# Patient Record
Sex: Male | Born: 1980 | Race: White | Hispanic: No | Marital: Single | Smoking: Never smoker
Health system: Southern US, Community
[De-identification: ages and names within clinical notes are randomized; demographics above are authoritative.]

---

## 2013-12-14 ENCOUNTER — Emergency Department (HOSPITAL_COMMUNITY)
Admission: EM | Admit: 2013-12-14 | Discharge: 2013-12-15 | Disposition: A | Discharge status: Home / Self Care | Payer: BC Managed Care – PPO | Attending: Emergency Medicine | Admitting: Emergency Medicine

## 2013-12-14 ENCOUNTER — Encounter (HOSPITAL_COMMUNITY): Payer: Self-pay | Admitting: Emergency Medicine

## 2013-12-14 DIAGNOSIS — J189 Pneumonia, unspecified organism: Secondary | ICD-10-CM

## 2013-12-14 DIAGNOSIS — IMO0001 Reserved for inherently not codable concepts without codable children: Secondary | ICD-10-CM | POA: Insufficient documentation

## 2013-12-14 DIAGNOSIS — R209 Unspecified disturbances of skin sensation: Secondary | ICD-10-CM | POA: Insufficient documentation

## 2013-12-14 DIAGNOSIS — J159 Unspecified bacterial pneumonia: Secondary | ICD-10-CM | POA: Insufficient documentation

## 2013-12-14 NOTE — ED Notes (Signed)
Up to nurse first, states, "meds starting to wear off, HA returning". Alert, NAD, calm, interactive, ambulatory with steady gait, wearing mask.

## 2013-12-14 NOTE — ED Notes (Signed)
C/o URI, cough, congestion, sore throat, post nasal drip, fever, post tussive emesis, (denies: nausea or diarrhea), has been taking mucinex, sudafed, dayquil & advil. Not getting better after 2 weeks.

## 2013-12-15 ENCOUNTER — Emergency Department (HOSPITAL_COMMUNITY): Payer: BC Managed Care – PPO

## 2013-12-15 MED ORDER — BENZONATATE 100 MG PO CAPS
100.0000 mg | ORAL_CAPSULE | Freq: Once | ORAL | Status: AC
  Administered 2013-12-15: 100 mg via ORAL
  Filled 2013-12-15: qty 1

## 2013-12-15 MED ORDER — AZITHROMYCIN 250 MG PO TABS
500.0000 mg | ORAL_TABLET | Freq: Once | ORAL | Status: AC
  Administered 2013-12-15: 500 mg via ORAL
  Filled 2013-12-15: qty 2

## 2013-12-15 MED ORDER — ACETAMINOPHEN-CODEINE 120-12 MG/5ML PO SUSP: 5.0000 mL | Freq: Four times a day (QID) | ORAL | Status: DC | PRN

## 2013-12-15 MED ORDER — ALBUTEROL SULFATE HFA 108 (90 BASE) MCG/ACT IN AERS
2.0000 | INHALATION_SPRAY | RESPIRATORY_TRACT | Status: DC | PRN
  Administered 2013-12-15: 2 via RESPIRATORY_TRACT
  Filled 2013-12-15 (×2): qty 6.7

## 2013-12-15 MED ORDER — IBUPROFEN 800 MG PO TABS: 800.0000 mg | ORAL_TABLET | Freq: Three times a day (TID) | ORAL | Status: DC

## 2013-12-15 MED ORDER — AZITHROMYCIN 250 MG PO TABS: 250.0000 mg | ORAL_TABLET | Freq: Every day | ORAL | Status: DC

## 2013-12-15 MED ORDER — IPRATROPIUM BROMIDE 0.02 % IN SOLN
0.5000 mg | Freq: Once | RESPIRATORY_TRACT | Status: AC
  Administered 2013-12-15: 0.5 mg via RESPIRATORY_TRACT
  Filled 2013-12-15: qty 2.5

## 2013-12-15 MED ORDER — KETOROLAC TROMETHAMINE 60 MG/2ML IM SOLN
60.0000 mg | Freq: Once | INTRAMUSCULAR | Status: AC
  Administered 2013-12-15: 60 mg via INTRAMUSCULAR
  Filled 2013-12-15: qty 2

## 2013-12-15 MED ORDER — ALBUTEROL SULFATE (2.5 MG/3ML) 0.083% IN NEBU
5.0000 mg | INHALATION_SOLUTION | Freq: Once | RESPIRATORY_TRACT | Status: AC
  Administered 2013-12-15: 5 mg via RESPIRATORY_TRACT
  Filled 2013-12-15: qty 6

## 2013-12-15 NOTE — ED Notes (Signed)
Diffuse ronchi through all lung fields clearing  Sl with coughing

## 2013-12-15 NOTE — ED Notes (Signed)
The pt is c/o a cough for 2 weeks with a temp hurting all over and he cannot rest unless he sits upright.   Hoarse cough at present

## 2013-12-15 NOTE — ED Provider Notes (Signed)
CSN: [id number]     Arrival date & time 12/14/13  2203 History   First MD Initiated Contact with Patient 12/15/13 0007     Chief Complaint  Patient presents with  . URI  . Nasal Congestion  . Cough  . Sore Throat   (Consider location/radiation/quality/duration/timing/severity/associated sxs/prior Treatment) HPI History provided by patient. Has been sick for the last 2 weeks with on and off fevers, persistent cough, congestion and sore throat. Multiple sick contacts including significant other at home, in addition to work contacts who have had similar symptoms. Taking over-the-counter medications with minimal relief. No rash. No recent travel. He denies any other medical problems. Is not a smoker. Symptoms moderate in severity and persistent which prompted him to be evaluated tonight.  History reviewed. No pertinent past medical history. History reviewed. No pertinent past surgical history. No family history on file. History  Substance Use Topics  . Smoking status: Never Smoker   . Smokeless tobacco: Not on file  . Alcohol Use: No    Review of Systems  Constitutional: Negative for fever and chills.  HENT: Positive for sore throat.   Respiratory: Positive for cough.   Cardiovascular: Negative for leg swelling.  Gastrointestinal: Negative for abdominal pain.  Genitourinary: Negative for dysuria.  Musculoskeletal: Positive for myalgias.  Skin: Negative for rash.  Neurological: Positive for numbness. Negative for weakness.  All other systems reviewed and are negative.    Allergies  Review of patient's allergies indicates no known allergies.  Home Medications  No current outpatient prescriptions on file. BP 151/97  Pulse 101  Temp(Src) 98.3 F (36.8 C) (Oral)  Resp 20  Wt 205 lb (92.987 kg)  SpO2 98% Physical Exam  Constitutional: He is oriented to person, place, and time. He appears well-developed and well-nourished.  HENT:  Head: Normocephalic and atraumatic.   Mouth/Throat: Oropharynx is clear and moist. No oropharyngeal exudate.  Eyes: EOM are normal. Pupils are equal, round, and reactive to light.  Neck: Neck supple.  Cardiovascular: Regular rhythm and intact distal pulses.   HR 101  Pulmonary/Chest: Effort normal. No stridor. No respiratory distress. He has no wheezes. He exhibits no tenderness.  Prolonged expirations  Abdominal: Soft. There is no tenderness.  Musculoskeletal: Normal range of motion. He exhibits no edema and no tenderness.  Neurological: He is alert and oriented to person, place, and time. No cranial nerve deficit.  Skin: Skin is warm and dry.    ED Course  Procedures (including critical care time) Labs Review Labs Reviewed - No data to display Imaging Review Dg Chest 2 View  12/15/2013   CLINICAL DATA:  Cough, fever  EXAM: CHEST  2 VIEW  COMPARISON:  None.  FINDINGS: The heart size and mediastinal contours are within normal limits. There is consolidation of right upper lobe. There is no pulmonary edema or pleural effusion. The visualized skeletal structures are unremarkable.  IMPRESSION: Right upper lobe pneumonia.   Electronically Signed   By: Sherian Rein M.D.   On: 12/15/2013 00:25   Room air pulse ox 98% is adequate  Toradol for body aches. Albuterol and Atrovent provided with some subjective improvement. Lung exam after medications is moving air better.   Chest x-ray reviewed and antibiotics provided.  And discharge home with albuterol inhaler, prescription for azithromycin and close primary care followup. Patient plans to see his significant other's position and [place]. He agrees to return precautions. Work note provided for 2 days.   MDM  Diagnosis: Community acquired pneumonia  Chest x-ray obtained and reviewed as above right upper lobe pneumonia - no comorbidities, no hypoxia, no indication for admit at this time Improved with medications provided Vital Signs and nursing notes reviewed and  considered.  Sunnie NielsenBrian Yovanni Frenette, MD 12/15/13 [id number]

## 2013-12-15 NOTE — Discharge Instructions (Signed)

## 2015-05-04 ENCOUNTER — Encounter (HOSPITAL_COMMUNITY): Payer: Self-pay

## 2015-05-04 ENCOUNTER — Emergency Department (HOSPITAL_COMMUNITY)
Admission: EM | Admit: 2015-05-04 | Discharge: 2015-05-04 | Disposition: A | Discharge status: Home / Self Care | Payer: Self-pay | Attending: Emergency Medicine | Admitting: Emergency Medicine

## 2015-05-04 DIAGNOSIS — L03213 Periorbital cellulitis: Secondary | ICD-10-CM

## 2015-05-04 DIAGNOSIS — Z791 Long term (current) use of non-steroidal anti-inflammatories (NSAID): Secondary | ICD-10-CM | POA: Insufficient documentation

## 2015-05-04 DIAGNOSIS — Z792 Long term (current) use of antibiotics: Secondary | ICD-10-CM | POA: Insufficient documentation

## 2015-05-04 DIAGNOSIS — H05012 Cellulitis of left orbit: Secondary | ICD-10-CM | POA: Insufficient documentation

## 2015-05-04 MED ORDER — AMOXICILLIN-POT CLAVULANATE 875-125 MG PO TABS: 1.0000 | ORAL_TABLET | Freq: Two times a day (BID) | ORAL | Status: DC

## 2015-05-04 MED ORDER — PREDNISONE 20 MG PO TABS
60.0000 mg | ORAL_TABLET | Freq: Once | ORAL | Status: AC
  Administered 2015-05-04: 60 mg via ORAL
  Filled 2015-05-04: qty 3

## 2015-05-04 MED ORDER — TOBRAMYCIN-DEXAMETHASONE 0.3-0.1 % OP SUSP: 2.0000 [drp] | Freq: Four times a day (QID) | OPHTHALMIC | Status: DC

## 2015-05-04 NOTE — Discharge Instructions (Signed)
Periorbital Cellulitis °Periorbital cellulitis is a common infection that can affect the eyelid and the soft tissues that surround the eyeball. The infection may also affect the structures that produce and drain tears. It does not affect the eyeball itself. Natural tissue barriers usually prevent the spread of this infection to the eyeball and other deeper areas of the eye socket.      °CAUSES °· Bacterial infection. °· Long-term (chronic) sinus infections. °· An object (foreign body) stuck behind the eye. °· An injury that goes through the eyelid tissues. °· An injury that causes an infection, such as an insect sting. °· Fracture of the bone around the eye. °· Infections which have spread from the eyelid or other structures around the eye. °· Bite wounds. °· Inflammation or infection of the lining membranes of the brain (meningitis). °· An infection in the blood (septicemia). °· Dental infection (abscess). °· Viral infection (this is rare). °SYMPTOMS °Symptoms usually come on suddenly. °· Pain in the eye. °· Red, hot, and swollen eyelids and possibly cheeks. The swelling is sometimes bad enough that the eyelids cannot open. Some infections make the eyelids look purple. °· Fever and feeling generally ill. °· Pain when touching the area around the eye. °DIAGNOSIS  °Periorbital cellulitis can be diagnosed from an eye exam. In severe cases, your caregiver might suggest: °· Blood tests. °· Imaging tests (such as a CT scan) to examine the sinuses and the area around and behind the eyeball. °TREATMENT °If your caregiver feels that you do not have any signs of serious infection, treatment may include: °· Antibiotics. °· Nasal decongestants to reduce swelling. °· Referral to a dentist if it is suspected that the infection was caused by a prior tooth infection. °· Examination every day to make sure the problem is improving. °HOME CARE INSTRUCTIONS °· Take your antibiotics as directed. Finish them even if you start to feel  better. °· Some pain is normal with this condition. Take pain medicine as directed by your caregiver. Only take pain medicines approved by your caregiver. °· It is important to drink fluids. Drink enough water and fluids to keep your urine clear or pale yellow. °· Do not smoke. °· Rest and get plenty of sleep. °· Mild or moderate fevers generally have no long-term effects and often do not require treatment. °· If your caregiver has given you a follow-up appointment, it is very important to keep that appointment. Your caregiver will need to make sure that the infection is getting better. It is important to check that a more serious infection is not developing. °SEEK IMMEDIATE MEDICAL CARE IF: °· Your eyelids become more painful, red, warm, or swollen. °· You develop double vision or your vision becomes blurred or worsens in any way. °· You have trouble moving your eyes. °· The eye looks like it is popping out (proptosis). °· You develop a severe headache, severe neck pain, or neck stiffness. °· You develop repeated vomiting. °· You have a fever or persistent symptoms for more than 72 hours. °· You have a fever and your symptoms suddenly get worse. °MAKE SURE YOU: °· Understand these instructions. °· Will watch your condition. °· Will get help right away if you are not doing well or get worse. °Document Released: 11/25/2010 Document Revised: 01/15/2012 Document Reviewed: 11/25/2010 °[employer]® Patient Information ©2015 [employer]. This information is not intended to replace advice given to you by your health care provider. Make sure you discuss any questions you have with your health care provider. ° °

## 2015-05-04 NOTE — ED Provider Notes (Signed)
CSN: [id number]     Arrival date & time 05/04/15  0846 History   First MD Initiated Contact with Patient 05/04/15 [phone]     Chief Complaint  Patient presents with  . Eye Pain  . Facial Swelling      HPI  Patient presents with a complaint of left eye pain and swelling. 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 States that occasionally he will feel like he got something into his eye "like  grit".. This happened on Friday, 4 days ago. He states that he flushed at work. However his continued to be "gritty feeling". It became swollen in the upper and lower lid this morning presents here. It is not affecting his vision. No blurring or doubling. Minimal photophobia. No consensual photophobia.  History reviewed. No pertinent past medical history. History reviewed. No pertinent past surgical history. History reviewed. No pertinent family history. History  Substance Use Topics  . Smoking status: Never Smoker   . Smokeless tobacco: Not on file  . Alcohol Use: Yes    Review of Systems  Constitutional: Negative for fever, chills, diaphoresis, appetite change and fatigue.  HENT: Negative for mouth sores, sore throat and trouble swallowing.   Eyes: Positive for photophobia, pain, discharge and redness. Negative for visual disturbance.  Respiratory: Negative for cough, chest tightness, shortness of breath and wheezing.   Cardiovascular: Negative for chest pain.  Gastrointestinal: Negative for nausea, vomiting, abdominal pain, diarrhea and abdominal distention.  Endocrine: Negative for polydipsia, polyphagia and polyuria.  Genitourinary: Negative for dysuria, frequency and hematuria.  Musculoskeletal: Negative for gait problem.  Skin: Negative for color change, pallor and rash.  Neurological: Negative for dizziness, syncope, light-headedness and headaches.  Hematological: Does not bruise/bleed easily.  Psychiatric/Behavioral: Negative for behavioral problems and confusion.      Allergies  Review of  patient's allergies indicates no known allergies.  Home Medications   Prior to Admission medications   Medication Sig Start Date End Date Taking? Authorizing Provider  acetaminophen-codeine 120-12 MG/5ML suspension Take 5 mLs by mouth every 6 (six) hours as needed for pain. 12/15/13   Sunnie Nielsen, MD  amoxicillin-clavulanate (AUGMENTIN) 875-125 MG per tablet Take 1 tablet by mouth 2 (two) times daily. 05/04/15   Rolland Porter, MD  azithromycin (ZITHROMAX) 250 MG tablet Take 1 tablet (250 mg total) by mouth daily. Take first 2 tablets together, then 1 every day until finished. 12/15/13   Sunnie Nielsen, MD  ibuprofen (ADVIL,MOTRIN) 800 MG tablet Take 1 tablet (800 mg total) by mouth 3 (three) times daily. 12/15/13   Sunnie Nielsen, MD  tobramycin-dexamethasone [facility]) ophthalmic solution Place 2 drops into the left eye every 6 (six) hours. 05/04/15   Rolland Porter, MD   BP 130/94 mmHg  Pulse 85  Temp(Src) 98.5 F (36.9 C) (Oral)  Resp 16  SpO2 98% Physical Exam  Constitutional: He is oriented to person, place, and time. He appears well-developed and well-nourished. No distress.  HENT:  Head: Normocephalic.  Eyes: Conjunctivae are normal. Pupils are equal, round, and reactive to light. No scleral icterus.    Neck: Normal range of motion. Neck supple. No thyromegaly present.  Cardiovascular: Normal rate and regular rhythm.  Exam reveals no gallop and no friction rub.   No murmur heard. Pulmonary/Chest: Effort normal and breath sounds normal. No respiratory distress. He has no wheezes. He has no rales.  Abdominal: Soft. Bowel sounds are normal. He exhibits no distension. There is no tenderness. There is no rebound.  Musculoskeletal: Normal range of  motion.  Neurological: He is alert and oriented to person, place, and time.  Skin: Skin is warm and dry. No rash noted.  Psychiatric: He has a normal mood and affect. His behavior is normal.    ED Course  Procedures (including critical care time) Labs  Review Labs Reviewed - No data to display  Imaging Review No results found.   EKG Interpretation None      MDM   Final diagnoses:  Periorbital cellulitis    Negative slit-lamp and woods lamp. Plan is home. Tobrex, Augmentin, "compresses. Recheck here as needed.    Rolland PorterMark Jedi Catalfamo, MD 05/04/15 [phone]

## 2015-05-04 NOTE — ED Notes (Signed)
Pt c/o increasing L eye pain and swelling x 4 days.  Pain score 7/10.  Pt sts "it feels like there is something scratching my eye."  Endorses lightsensitivity.  Pt used Visine which exacerbated symptoms.

## 2015-05-06 ENCOUNTER — Encounter (HOSPITAL_BASED_OUTPATIENT_CLINIC_OR_DEPARTMENT_OTHER): Payer: Self-pay | Admitting: Emergency Medicine

## 2015-12-10 ENCOUNTER — Encounter (HOSPITAL_COMMUNITY): Payer: Self-pay | Admitting: Emergency Medicine

## 2015-12-10 ENCOUNTER — Emergency Department (HOSPITAL_COMMUNITY)
Admission: EM | Admit: 2015-12-10 | Discharge: 2015-12-10 | Disposition: A | Discharge status: Home / Self Care | Payer: Self-pay | Attending: Physician Assistant | Admitting: Physician Assistant

## 2015-12-10 DIAGNOSIS — Z791 Long term (current) use of non-steroidal anti-inflammatories (NSAID): Secondary | ICD-10-CM | POA: Insufficient documentation

## 2015-12-10 DIAGNOSIS — Z7952 Long term (current) use of systemic steroids: Secondary | ICD-10-CM | POA: Insufficient documentation

## 2015-12-10 DIAGNOSIS — H01006 Unspecified blepharitis left eye, unspecified eyelid: Secondary | ICD-10-CM

## 2015-12-10 DIAGNOSIS — Z792 Long term (current) use of antibiotics: Secondary | ICD-10-CM | POA: Insufficient documentation

## 2015-12-10 DIAGNOSIS — H01005 Unspecified blepharitis left lower eyelid: Secondary | ICD-10-CM | POA: Insufficient documentation

## 2015-12-10 MED ORDER — TETRACAINE HCL 0.5 % OP SOLN
2.0000 [drp] | Freq: Once | OPHTHALMIC | Status: AC
  Administered 2015-12-10: 2 [drp] via OPHTHALMIC
  Filled 2015-12-10: qty 4

## 2015-12-10 MED ORDER — HYDROCODONE-ACETAMINOPHEN 5-325 MG PO TABS
2.0000 | ORAL_TABLET | ORAL | Status: AC | PRN
Start: 2015-12-10 — End: ?

## 2015-12-10 MED ORDER — FLUORESCEIN SODIUM 1 MG OP STRP
1.0000 | ORAL_STRIP | Freq: Once | OPHTHALMIC | Status: AC
  Administered 2015-12-10: 1 via OPHTHALMIC
  Filled 2015-12-10: qty 1

## 2015-12-10 MED ORDER — ERYTHROMYCIN 5 MG/GM OP OINT: 1.0000 "application " | TOPICAL_OINTMENT | Freq: Four times a day (QID) | OPHTHALMIC | Status: AC

## 2015-12-10 NOTE — ED Notes (Signed)
Per pt, states left eye swollen-symptoms started 5 days ago

## 2015-12-10 NOTE — Discharge Instructions (Signed)
Take your medication as prescribed. I recommend continuing to apply warm compresses to close lid for 5-10 minutes 3-4 times daily. Continue washing her hands after touching the air are around dry to prevent spread of infection. Follow-up with ophthalmology clinic listed above in the next 2 days. Return to the emergency department if symptoms worsen or new onset of fever, pain up with moving her eye, visual changes, worsening swelling or drainage.

## 2015-12-10 NOTE — ED Provider Notes (Signed)
History  By signing my name below, I, Karle Plumber, attest that this documentation has been prepared under the direction and in the presence of Melburn Hake, [place]. Electronically Signed: Karle Plumber, ED Scribe. 12/10/2015. 7:21 PM  Chief Complaint  Patient presents with  . Eye Pain   The history is provided by the patient and medical records. No language interpreter was used.    HPI Comments:  Andrew Melendez is a 35 y.o. obese male who presents to the Emergency Department complaining of worsening, left lower lid swelling and severe pain that began yesterday. He states his right eye started draining and itching about 5 days ago and he states that it drained into his left eye. He states the left eye began draining a whitish-green "string cheese" substance last night with manipulation of the lower lid. He has been applying warm compresses with mild intermittent relief. Pt has not taken anything for pain. He denies modifying factors. He denies wearing contact lenses. He denies fever, chills, foreign body sensation, visual changes, photophobia. He denies any trauma or injury to the area.   History reviewed. No pertinent past medical history. History reviewed. No pertinent past surgical history. No family history on file. Social History  Substance Use Topics  . Smoking status: Never Smoker   . Smokeless tobacco: None  . Alcohol Use: Yes    Review of Systems  Constitutional: Negative for fever and chills.  Eyes: Positive for pain (left lower lid), discharge and redness. Negative for visual disturbance.       Left lower lid swelling    Allergies  Review of patient's allergies indicates no known allergies.  Home Medications   Prior to Admission medications   Medication Sig Start Date End Date Taking? Authorizing Provider  acetaminophen-codeine 120-12 MG/5ML suspension Take 5 mLs by mouth every 6 (six) hours as needed for pain. 12/15/13   Sunnie Nielsen, MD  amoxicillin-clavulanate  (AUGMENTIN) 875-125 MG per tablet Take 1 tablet by mouth 2 (two) times daily. 05/04/15   Rolland Porter, MD  azithromycin (ZITHROMAX) 250 MG tablet Take 1 tablet (250 mg total) by mouth daily. Take first 2 tablets together, then 1 every day until finished. 12/15/13   Sunnie Nielsen, MD  erythromycin ophthalmic ointment Place 1 application into the left eye every 6 (six) hours. Place 1/2 inch ribbon of ointment in the affected eye 4 times a day for 5 days 12/10/15   Barrett Henle, PA-C  HYDROcodone-acetaminophen (NORCO/VICODIN) 5-325 MG tablet Take 2 tablets by mouth every 4 (four) hours as needed. 12/10/15   Barrett Henle, PA-C  ibuprofen (ADVIL,MOTRIN) 800 MG tablet Take 1 tablet (800 mg total) by mouth 3 (three) times daily. 12/15/13   Sunnie Nielsen, MD  tobramycin-dexamethasone [facility]) ophthalmic solution Place 2 drops into the left eye every 6 (six) hours. 05/04/15   Rolland Porter, MD   Triage Vitals: BP 162/99 mmHg  Pulse 103  Temp(Src) 98.2 F (36.8 C) (Oral)  Resp 20  SpO2 100% Physical Exam  Constitutional: He is oriented to person, place, and time. He appears well-developed and well-nourished.  HENT:  Head: Normocephalic and atraumatic.  Eyes: EOM are normal. Pupils are equal, round, and reactive to light. Lids are everted and swept, no foreign bodies found. Right eye exhibits no chemosis and no discharge. Left eye exhibits discharge (white discharge noted beneath lower lid). Left eye exhibits no chemosis and no hordeolum. No foreign body present in the left eye. Right conjunctiva is not injected. Left conjunctiva is  injected. Left conjunctiva has no hemorrhage. No scleral icterus.  Slit lamp exam:      The left eye shows no corneal abrasion, no corneal flare, no corneal ulcer, no foreign body, no hyphema, no hypopyon and no fluorescein uptake.  Mild swelling and erythema noted to left lower lid with small amount of crusting noted around lashes.  Neck: Normal range of motion.   Cardiovascular: Normal rate.   Pulmonary/Chest: Effort normal.  Musculoskeletal: Normal range of motion.  Neurological: He is alert and oriented to person, place, and time.  Skin: Skin is warm and dry.  Psychiatric: He has a normal mood and affect. His behavior is normal.  Nursing note and vitals reviewed.   ED Course  Procedures (including critical care time) DIAGNOSTIC STUDIES: Oxygen Saturation is 100% on RA, normal by my interpretation.   COORDINATION OF CARE: 6:51 PM- Will instill tetracaine drops and apply fluorescein strip to check for abrasions. Pt verbalizes understanding and agrees to plan.  Medications  fluorescein ophthalmic strip 1 strip (1 strip Left Eye Given 12/10/15 1930)  tetracaine (PONTOCAINE) 0.5 % ophthalmic solution 2 drop (2 drops Left Eye Given 12/10/15 1930)   Filed Vitals:   12/10/15 1815  BP: 162/99  Pulse: 103  Temp: 98.2 F (36.8 C)  Resp: 20     MDM   Final diagnoses:  Blepharitis, left    Pt presents with symptoms consistent with blepharitis.  No corneal abrasions, entrapment, consensual photophobia, or dendritic staining with fluorescein study. Visual acuity unremarkable. Presentation non-concerning for iritis, corneal abrasions, or HSV.  No evidence of preseptal or orbital cellulitis.  Pt is not a contact lens wearer.  Patient will be given erythromycin ophthalmic and pain meds.  Personal hygiene and frequent handwashing discussed.  Patient advised to followup with ophthalmologist for reevaluation .  Patient verbalizes understanding and is agreeable with discharge.  I personally performed the services described in this documentation, which was scribed in my presence. The recorded information has been reviewed and is accurate.     Satira Sark [place], [place] 12/11/15 1149  Courteney Randall An, MD 12/11/15 [phone]

## 2015-12-12 ENCOUNTER — Encounter (HOSPITAL_COMMUNITY): Payer: Self-pay | Admitting: Emergency Medicine

## 2015-12-12 ENCOUNTER — Emergency Department (HOSPITAL_COMMUNITY)
Admission: EM | Admit: 2015-12-12 | Discharge: 2015-12-12 | Disposition: A | Discharge status: Home / Self Care | Payer: Self-pay | Attending: Emergency Medicine | Admitting: Emergency Medicine

## 2015-12-12 DIAGNOSIS — H00016 Hordeolum externum left eye, unspecified eyelid: Secondary | ICD-10-CM

## 2015-12-12 DIAGNOSIS — T495X5A Adverse effect of ophthalmological drugs and preparations, initial encounter: Secondary | ICD-10-CM | POA: Insufficient documentation

## 2015-12-12 DIAGNOSIS — T7840XA Allergy, unspecified, initial encounter: Secondary | ICD-10-CM

## 2015-12-12 DIAGNOSIS — H00015 Hordeolum externum left lower eyelid: Secondary | ICD-10-CM | POA: Insufficient documentation

## 2015-12-12 MED ORDER — TETRACAINE HCL 0.5 % OP SOLN
1.0000 [drp] | Freq: Once | OPHTHALMIC | Status: AC
  Administered 2015-12-12: 1 [drp] via OPHTHALMIC
  Filled 2015-12-12: qty 4

## 2015-12-12 MED ORDER — OXYCODONE-ACETAMINOPHEN 5-325 MG PO TABS
2.0000 | ORAL_TABLET | ORAL | Status: AC | PRN
Start: 2015-12-12 — End: ?

## 2015-12-12 MED ORDER — MORPHINE SULFATE (PF) 4 MG/ML IV SOLN
4.0000 mg | Freq: Once | INTRAVENOUS | Status: AC
  Administered 2015-12-12: 4 mg via INTRAMUSCULAR
  Filled 2015-12-12: qty 1

## 2015-12-12 MED ORDER — CEPHALEXIN 500 MG PO CAPS: 500.0000 mg | ORAL_CAPSULE | Freq: Four times a day (QID) | ORAL | Status: AC

## 2015-12-12 NOTE — ED Provider Notes (Signed)
CSN: [id number]     Arrival date & time 12/12/15  5366 History   First MD Initiated Contact with Patient 12/12/15 [phone]     Chief Complaint  Patient presents with  . Eye Pain  . Conjunctivitis    HPI    35 year old male presents today with pain to his left eye. Patient was seen on 12/10/2015 for pain redness and discharge from his left lower eyelid. He was placed on erythromycin eyedrops. He states that yesterday he took the first dose in the morning felt some burning, after his second dose he noticed worsening swelling, and pain in the eye. Patient reports that he is having blurred vision and sever pain out of the eye. Patient denies any worsening surrounding swelling, reports some pain with right sided visual gaze, but no significant painful ocular movements. He denies any fever, chills, nausea, vomiting.   History reviewed. No pertinent past medical history. History reviewed. No pertinent past surgical history. No family history on file. Social History  Substance Use Topics  . Smoking status: Never Smoker   . Smokeless tobacco: None  . Alcohol Use: Yes    Review of Systems  All other systems reviewed and are negative.   Allergies  Review of patient's allergies indicates no known allergies.  Home Medications   Prior to Admission medications   Medication Sig Start Date End Date Taking? Authorizing Provider  erythromycin ophthalmic ointment Place 1 application into the left eye every 6 (six) hours. Place 1/2 inch ribbon of ointment in the affected eye 4 times a day for 5 days 12/10/15  Yes Barrett Henle, PA-C  HYDROcodone-acetaminophen (NORCO/VICODIN) 5-325 MG tablet Take 2 tablets by mouth every 4 (four) hours as needed. Patient taking differently: Take 2 tablets by mouth every 4 (four) hours as needed for moderate pain.  12/10/15  Yes Satira Sark Nadeau, PA-C  cephALEXin (KEFLEX) 500 MG capsule Take 1 capsule (500 mg total) by mouth 4 (four) times daily. 12/12/15    Eyvonne Mechanic, PA-C  oxyCODONE-acetaminophen (PERCOCET/ROXICET) 5-325 MG tablet Take 2 tablets by mouth every 4 (four) hours as needed for severe pain. 12/12/15   Eyvonne Mechanic, PA-C   BP 153/89 mmHg  Pulse 91  Temp(Src) 98.7 F (37.1 C) (Oral)  Resp 181  SpO2 96%   Physical Exam  Constitutional: He is oriented to person, place, and time. He appears well-developed and well-nourished.  HENT:  Head: Normocephalic and atraumatic.  Eyes: Conjunctivae are normal. Pupils are equal, round, and reactive to light. Right eye exhibits no discharge. Left eye exhibits no discharge. No scleral icterus.  Chemosis of the left eye, no signs of fluid within the anterior chamber  Neck: Normal range of motion. No JVD present. No tracheal deviation present.  Pulmonary/Chest: Effort normal. No stridor.  Neurological: He is alert and oriented to person, place, and time. Coordination normal.  Psychiatric: He has a normal mood and affect. His behavior is normal. Judgment and thought content normal.  Nursing note and vitals reviewed.       ED Course  Procedures (including critical care time) Labs Review Labs Reviewed - No data to display  Imaging Review No results found. I have personally reviewed and evaluated these images and lab results as part of my medical decision-making.   EKG Interpretation None      MDM   Final diagnoses:  Allergic reaction, initial encounter  Stye, left   Labs:  Imaging:  Consults:  Therapeutics: Morphine, tetracaine  Discharge Meds: Percocet, Keflex  Assessment/Plan: 35 year old male presents today with likely allergic reaction. Patient was seen for likely sty of his left lower eyelid, placed on erythromycin, and developed an allergic reaction to this. I have low suspicion for preseptal cellulitis,, I have consult did ophthalmology Dr. Cathey Endow who reports this is likely allergic reaction to the erythromycin, and requested the patient come to his office at 2:30  for evaluation. Patient's phone number was given to Dr. Cathey Endow, follow-up information was given to the patient who reports he will go immediately to the ophthalmologist for further evaluation and management. Patient was discharged home on Keflex at ophthalmology recommendation, and given Percocet for the pain.  Patient discharged to follow up with ophthalmology.         Eyvonne Mechanic, PA-C 12/13/15 1556  Cathren Laine, MD 12/14/15 1524

## 2015-12-12 NOTE — ED Notes (Signed)
Pt left erythromycin medication and reported facility to "throw them away".  Medication thrown in black bin.

## 2015-12-12 NOTE — ED Notes (Signed)
Pt c/o left periorbital edema and pain x 3 days, was seen in ED 1.5 days ago, diagnosed with obstructed tear duct, prescribed erythromycin. Pt's lower eyelid is significantly edematous with some white purulent discharge, left conjuctiva is markedly edematous. Vision is blurred.

## 2015-12-12 NOTE — ED Notes (Signed)
Awake. Verbally responsive. A/O x4. Resp even and unlabored. No audible adventitious breath sounds noted. ABC's intact.  

## 2015-12-12 NOTE — Discharge Instructions (Signed)
Please go to Dr. Ovidio Kin office today at 2:30 for further evaluation.

## 2017-09-14 ENCOUNTER — Other Ambulatory Visit: Payer: Self-pay

## 2017-09-14 ENCOUNTER — Emergency Department (HOSPITAL_COMMUNITY): Payer: Worker's Compensation

## 2017-09-14 ENCOUNTER — Encounter (HOSPITAL_COMMUNITY): Payer: Self-pay | Admitting: Emergency Medicine

## 2017-09-14 ENCOUNTER — Emergency Department (HOSPITAL_COMMUNITY)
Admission: EM | Admit: 2017-09-14 | Discharge: 2017-09-14 | Disposition: A | Discharge status: Home / Self Care | Payer: Worker's Compensation | Attending: Emergency Medicine | Admitting: Emergency Medicine

## 2017-09-14 DIAGNOSIS — Z79899 Other long term (current) drug therapy: Secondary | ICD-10-CM | POA: Diagnosis not present

## 2017-09-14 DIAGNOSIS — M25561 Pain in right knee: Secondary | ICD-10-CM | POA: Diagnosis not present

## 2017-09-14 MED ORDER — IBUPROFEN 800 MG PO TABS: 800.0000 mg | ORAL_TABLET | Freq: Three times a day (TID) | ORAL | 0 refills | Status: AC | PRN

## 2017-09-14 MED ORDER — IBUPROFEN 800 MG PO TABS
800.0000 mg | ORAL_TABLET | Freq: Once | ORAL | Status: AC
  Administered 2017-09-14: 800 mg via ORAL
  Filled 2017-09-14: qty 1

## 2017-09-14 NOTE — ED Provider Notes (Signed)
[facility]-EMERGENCY DEPT Provider Note   CSN: [id number] Arrival date & time: 09/14/17  [id number]     History   Chief Complaint Chief Complaint  Patient presents with  . Knee Pain    HPI Assunta Founddam Wade is a 36 y.o. male.  The history is provided by the patient and medical records. No language interpreter was used.   Assunta Founddam Kapusta is an otherwise healthy 36 y.o. male  who presents to the Emergency Department complaining of progressively worsening right knee pain. Intermittent over the last month, but last week he bent down to fix a machine at work and pain to medial and central knee intensified. This week, pain has been worse with any flexion/extension. He has been ambulating, but with limp. Tried ice and heat with no relief. Has not tried any medications. Denies prior injury to RLE. No numbness, tingling, weakness.   History reviewed. No pertinent past medical history.  There are no active problems to display for this patient.   History reviewed. No pertinent surgical history.     Home Medications    Prior to Admission medications   Medication Sig Start Date End Date Taking? Authorizing Provider  cephALEXin (KEFLEX) 500 MG capsule Take 1 capsule (500 mg total) by mouth 4 (four) times daily. 12/12/15   Hedges, Tinnie GensJeffrey, PA-C  erythromycin ophthalmic ointment Place 1 application into the left eye every 6 (six) hours. Place 1/2 inch ribbon of ointment in the affected eye 4 times a day for 5 days 12/10/15   Barrett HenleNadeau, Nicole Elizabeth, PA-C  HYDROcodone-acetaminophen (NORCO/VICODIN) 5-325 MG tablet Take 2 tablets by mouth every 4 (four) hours as needed. Patient taking differently: Take 2 tablets by mouth every 4 (four) hours as needed for moderate pain.  12/10/15   Barrett HenleNadeau, Nicole Elizabeth, PA-C  ibuprofen (ADVIL,MOTRIN) 800 MG tablet Take 1 tablet (800 mg total) every 8 (eight) hours as needed by mouth for mild pain or moderate pain. 09/14/17   Mackenzy Grumbine, Chase PicketJaime Pilcher, PA-C    oxyCODONE-acetaminophen (PERCOCET/ROXICET) 5-325 MG tablet Take 2 tablets by mouth every 4 (four) hours as needed for severe pain. 12/12/15   Eyvonne MechanicHedges, Jeffrey, PA-C    Family History No family history on file.  Social History Social History   Tobacco Use  . Smoking status: Never Smoker  Substance Use Topics  . Alcohol use: Yes  . Drug use: No     Allergies   Patient has no known allergies.   Review of Systems Review of Systems  Constitutional: Negative for fever.  Musculoskeletal: Positive for arthralgias and myalgias.  Skin: Negative for color change and wound.  Neurological: Negative for weakness and numbness.     Physical Exam Updated Vital Signs BP (!) 153/103   Pulse 69   Temp 98.5 F (36.9 C)   Resp 18   SpO2 100%   Physical Exam  Constitutional: He appears well-developed and well-nourished. No distress.  HENT:  Head: Normocephalic and atraumatic.  Neck: Neck supple.  Cardiovascular: Normal rate, regular rhythm and normal heart sounds.  No murmur heard. Pulmonary/Chest: Effort normal and breath sounds normal. No respiratory distress. He has no wheezes. He has no rales.  Musculoskeletal:  TTP of right patellar tendon and medial joint line. Crepitus to patellar tendon. Full ROM. No joint effusion or swelling appreciated. No abnormal alignment or patellar mobility. No bruising, erythema or warmth overlaying the joint. No varus/valgus laxity. Negative drawer's and Lachman's. 2+ DP pulses bilaterally. All compartments are soft. Sensation intact distal to injury.  Neurological: He is alert.  Skin: Skin is warm and dry.  Nursing note and vitals reviewed.    ED Treatments / Results  Labs (all labs ordered are listed, but only abnormal results are displayed) Labs Reviewed - No data to display  EKG  EKG Interpretation None       Radiology Dg Knee Complete 4 Views Right  Result Date: 09/14/2017 CLINICAL DATA:  Pain following twisting injury EXAM: RIGHT  KNEE - COMPLETE 4+ VIEW COMPARISON:  None. FINDINGS: Frontal, lateral, and bilateral oblique views were obtained. There is no fracture or dislocation. No joint effusion. Joint spaces appear normal. No erosive change. IMPRESSION: No fracture or joint effusion.  No evident arthropathy. Electronically Signed   By: Bretta BangWilliam  Woodruff III M.D.   On: 09/14/2017 09:40    Procedures Procedures (including critical care time)  Medications Ordered in ED Medications  ibuprofen (ADVIL,MOTRIN) tablet 800 mg (800 mg Oral Given 09/14/17 1001)     Initial Impression / Assessment and Plan / ED Course  I have reviewed the triage vital signs and the nursing notes.  Pertinent labs & imaging results that were available during my care of the patient were reviewed by me and considered in my medical decision making (see chart for details).    Assunta Founddam Yankovich is a 36 y.o. male who presents to ED for right knee pain. NVI on exam. No calf tenderness. Tender along patellar tendon and medial joint line. X-ray with no acute abnormalities. Crutches and knee sleeve provided. Symptomatic home care instructions discussed. Ortho follow up in 1 week if symptoms not resolved. Return precautions discussed and all questions answered.   Final Clinical Impressions(s) / ED Diagnoses   Final diagnoses:  Acute pain of right knee    ED Discharge Orders        Ordered    ibuprofen (ADVIL,MOTRIN) 800 MG tablet  Every 8 hours PRN     09/14/17 1033       Lejla Moeser, Chase PicketJaime Pilcher, PA-C 09/14/17 1040    Shaune PollackIsaacs, Cameron, MD 09/15/17 1348

## 2017-09-14 NOTE — ED Triage Notes (Signed)
Pt complaint of right knee pain worsening over past month; unknown if had injury.

## 2017-09-14 NOTE — Discharge Instructions (Signed)
It was my pleasure taking care of you today!   Ibuprofen as needed for pain. Use crutches as needed for comfort. Ice and elevate knee throughout the day.  Call the orthopedist listed today or tomorrow to schedule follow up appointment for recheck of ongoing knee pain in one to two weeks. That appointment can be canceled with a 24-48 hour notice if complete resolution of pain.  Return to the ER for new or worsening symptoms, any additional concerns.

## 2017-10-08 ENCOUNTER — Other Ambulatory Visit: Payer: Self-pay | Admitting: Orthopedic Surgery

## 2017-10-08 DIAGNOSIS — S83411A Sprain of medial collateral ligament of right knee, initial encounter: Secondary | ICD-10-CM

## 2017-10-13 ENCOUNTER — Ambulatory Visit
Admission: RE | Admit: 2017-10-13 | Discharge: 2017-10-13 | Disposition: A | Discharge status: Home / Self Care | Payer: Worker's Compensation | Source: Ambulatory Visit | Attending: Orthopedic Surgery | Admitting: Orthopedic Surgery

## 2017-10-13 DIAGNOSIS — S83411A Sprain of medial collateral ligament of right knee, initial encounter: Secondary | ICD-10-CM

## 2018-05-10 IMAGING — MR MR KNEE*R* W/O CM
4 of 6 series · 19 of 40 positions shown · non-contrast
Comparison: Plain films of the right knee 09/14/2017.

CLINICAL DATA: Right knee pain superior and inferior to the
patella. The patient suffered a fall from a ladder 4 weeks ago.
Subsequent encounter.

EXAM:
MRI OF THE RIGHT KNEE WITHOUT CONTRAST
TECHNIQUE: Multiplanar, multisequence MR imaging of the knee was performed. No
intravenous contrast was administered.

[Series 3: PD fat-sat · axial · 4.0mm · 0.31mm/px · z∈[-41,+64]mm · 8 of 25 slices shown (1 of 4)]
[im 1/25]
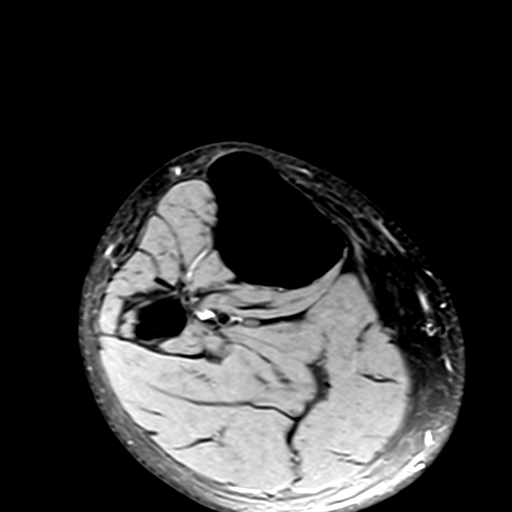
[im 4/25]
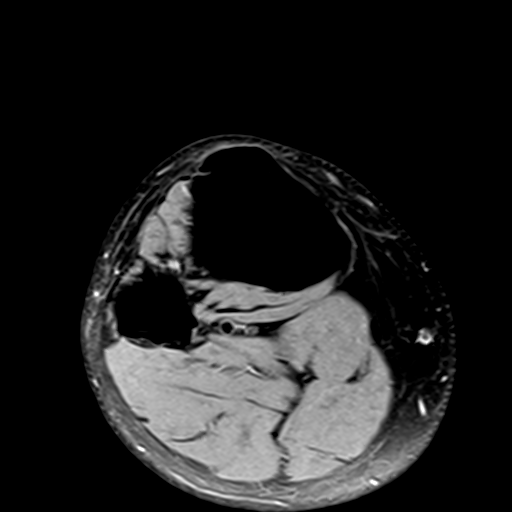
[im 7/25]
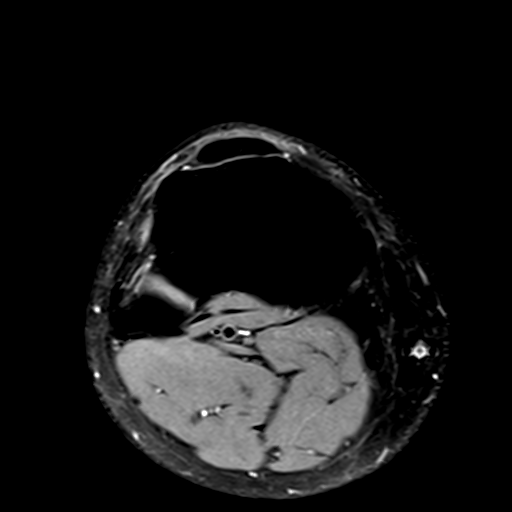
[im 11/25]
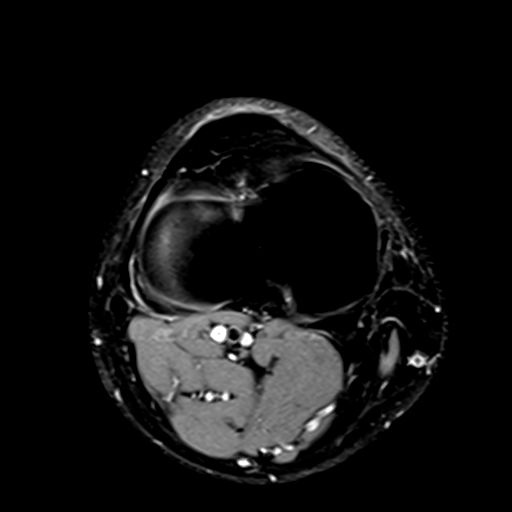
[im 14/25]
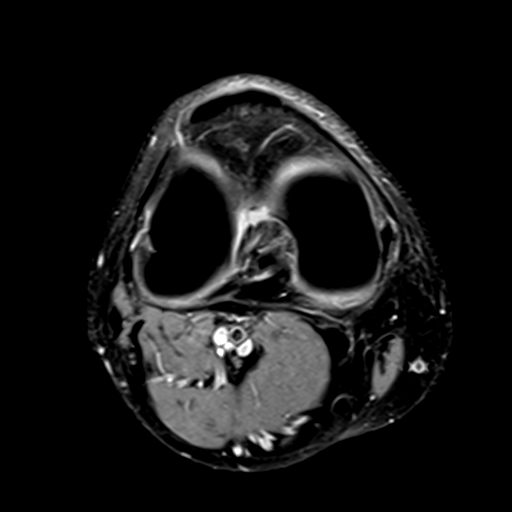
[im 18/25]
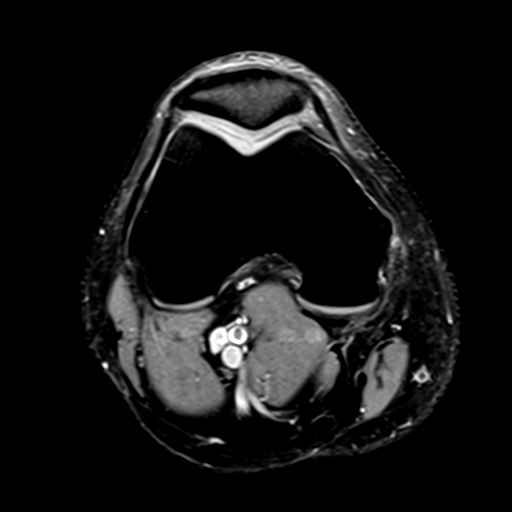
[im 21/25]
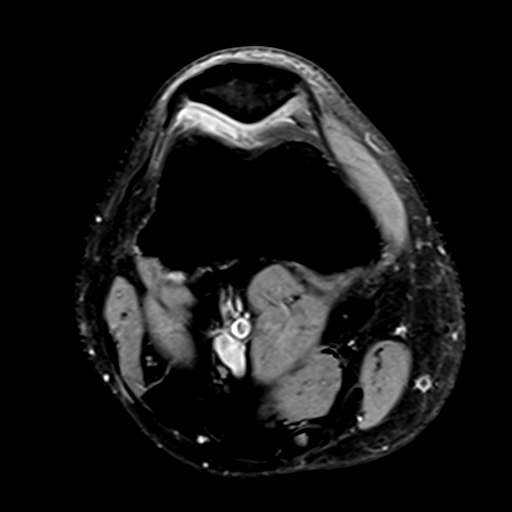
[im 25/25]
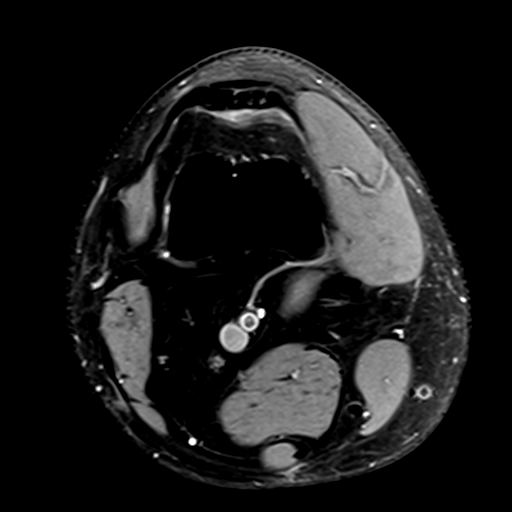

[Series 5: PD fat-sat · sagittal · 3.5mm · 0.31mm/px · 5 of 22 slices shown (2 of 4)]
[im 1/22]
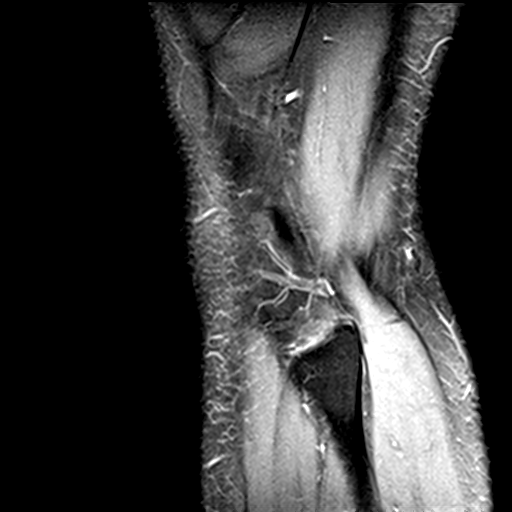
[im 4/22]
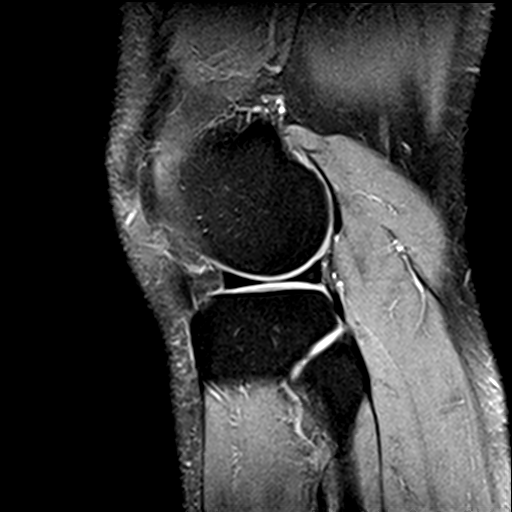
[im 8/22]
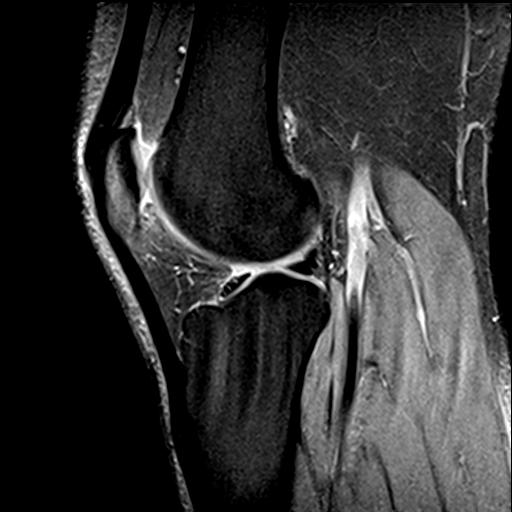
[im 11/22]
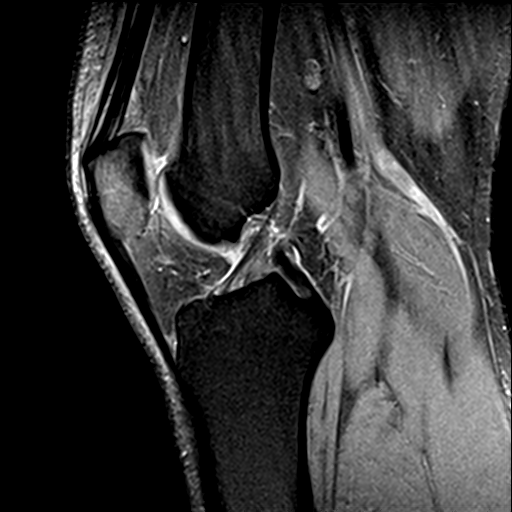
[im 18/22]
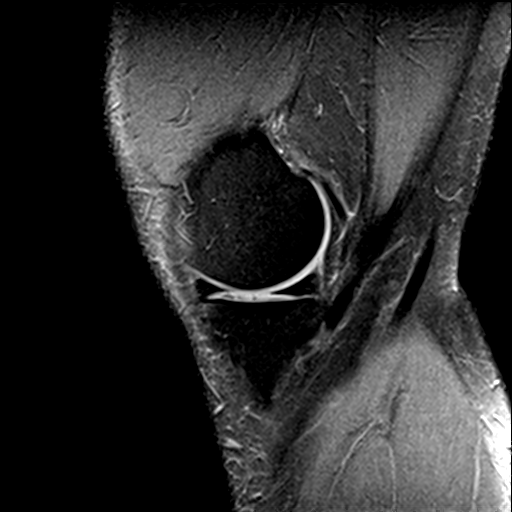

[Series 7: PD fat-sat · coronal · 3.5mm · 0.31mm/px · 3 of 21 slices shown (3 of 4)]
[im 4/21]
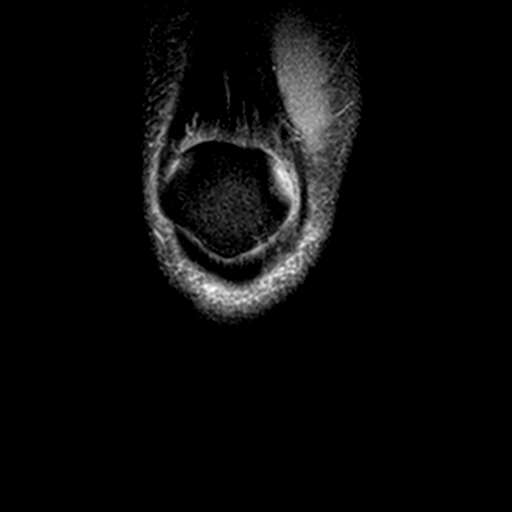
[im 11/21]
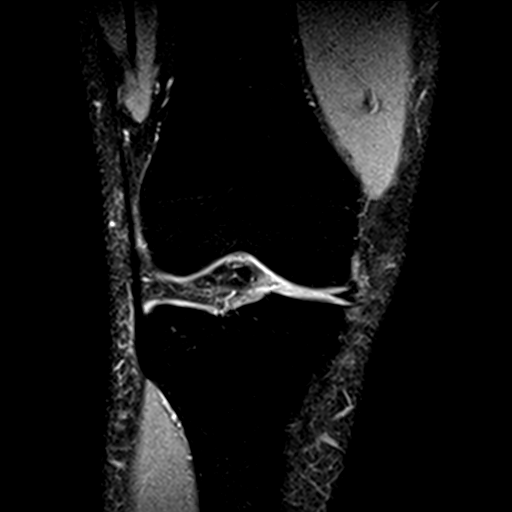
[im 17/21]
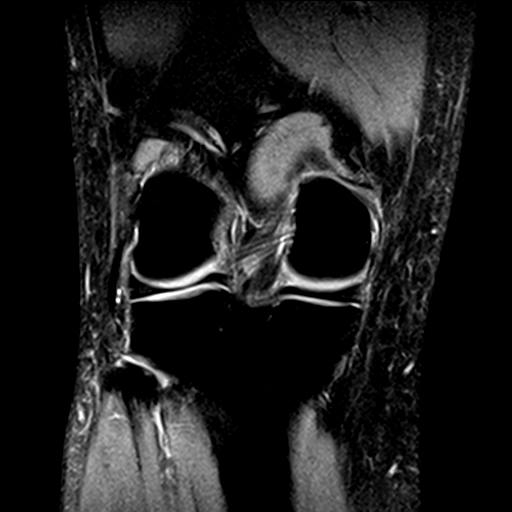

[Series 8: PD fat-sat · coronal · 2.0mm · 0.29mm/px · 3 of 11 slices shown (4 of 4)]
[im 1/11]
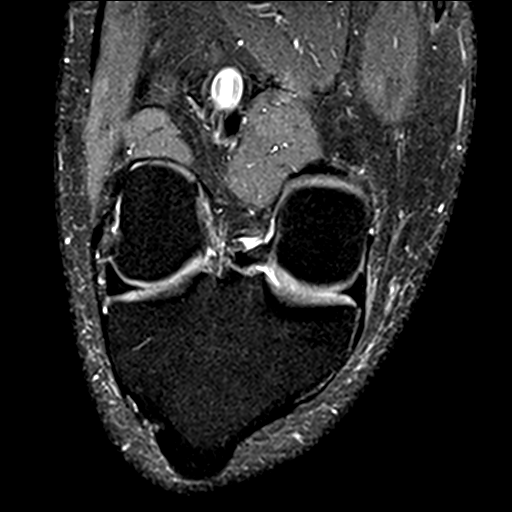
[im 7/11]
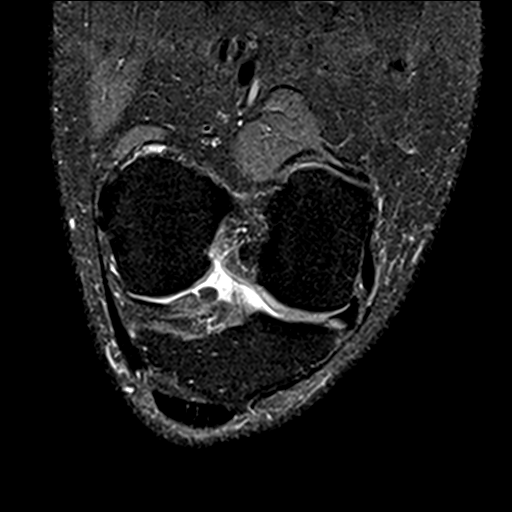
[im 11/11]
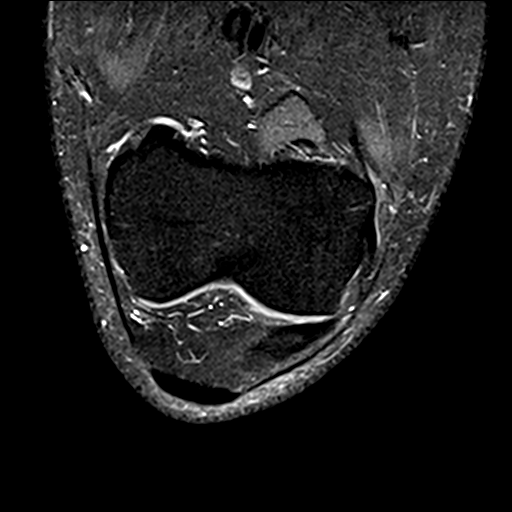

[19 of 40 positions shown; findings below may reference images not displayed]

FINDINGS: MENISCI

Medial meniscus:  Intact.

Lateral meniscus:  Intact.

LIGAMENTS

Cruciates:  Intact.

Collaterals:  Intact.

CARTILAGE

Patellofemoral: A fissure in cartilage is seen on the medial facet
subjacent to a medial plica.

Medial:  Normal.

Lateral:  Normal.

Joint:  No effusion.

Popliteal Fossa:  No Baker's cyst.

Extensor Mechanism:  Intact.

Bones:  Normal marrow signal throughout.

Other: None.
IMPRESSION: No acute abnormality.  Negative for meniscal or ligament tear.

Fissure in hyaline cartilage adjacent to a thickened medial plica
compatible with medial plica syndrome.
